# Patient Record
Sex: Female | Born: 1960 | Race: White | Hispanic: No | Marital: Married | Smoking: Never smoker
Health system: Southern US, Community
[De-identification: ages and names within clinical notes are randomized; demographics above are authoritative.]

## PROBLEM LIST (undated history)

## (undated) DIAGNOSIS — I059 Rheumatic mitral valve disease, unspecified: Secondary | ICD-10-CM

## (undated) DIAGNOSIS — F419 Anxiety disorder, unspecified: Secondary | ICD-10-CM

## (undated) DIAGNOSIS — K5792 Diverticulitis of intestine, part unspecified, without perforation or abscess without bleeding: Secondary | ICD-10-CM

## (undated) DIAGNOSIS — K589 Irritable bowel syndrome without diarrhea: Secondary | ICD-10-CM

## (undated) DIAGNOSIS — D569 Thalassemia, unspecified: Secondary | ICD-10-CM

## (undated) HISTORY — DX: Anxiety disorder, unspecified: F41.9

## (undated) HISTORY — PX: TONSILLECTOMY: SUR1361

## (undated) HISTORY — DX: Irritable bowel syndrome, unspecified: K58.9

## (undated) HISTORY — PX: COLONOSCOPY: SHX174

---

## 2005-01-05 ENCOUNTER — Ambulatory Visit: Payer: Self-pay | Admitting: Family Medicine

## 2005-05-16 ENCOUNTER — Ambulatory Visit: Payer: Self-pay | Admitting: Family Medicine

## 2005-08-30 ENCOUNTER — Ambulatory Visit: Payer: Self-pay

## 2006-09-18 ENCOUNTER — Ambulatory Visit: Payer: Self-pay | Admitting: Obstetrics and Gynecology

## 2007-03-08 ENCOUNTER — Emergency Department: Payer: Self-pay | Admitting: Emergency Medicine

## 2007-03-08 ENCOUNTER — Other Ambulatory Visit: Payer: Self-pay

## 2007-03-09 ENCOUNTER — Other Ambulatory Visit: Payer: Self-pay

## 2007-11-13 ENCOUNTER — Ambulatory Visit: Payer: Self-pay

## 2007-11-26 ENCOUNTER — Ambulatory Visit: Payer: Self-pay

## 2008-05-26 ENCOUNTER — Ambulatory Visit: Payer: Self-pay

## 2008-11-18 ENCOUNTER — Ambulatory Visit: Payer: Self-pay

## 2009-06-25 ENCOUNTER — Ambulatory Visit: Payer: Self-pay

## 2009-12-07 ENCOUNTER — Ambulatory Visit: Payer: Self-pay

## 2010-12-14 ENCOUNTER — Ambulatory Visit: Payer: Self-pay

## 2012-02-22 ENCOUNTER — Ambulatory Visit: Payer: Self-pay | Admitting: Obstetrics and Gynecology

## 2012-11-21 ENCOUNTER — Ambulatory Visit: Payer: Self-pay | Admitting: Internal Medicine

## 2013-06-26 ENCOUNTER — Ambulatory Visit: Payer: Self-pay

## 2013-07-03 ENCOUNTER — Ambulatory Visit: Payer: Self-pay

## 2013-08-12 ENCOUNTER — Ambulatory Visit: Payer: Self-pay | Admitting: Family Medicine

## 2014-02-10 DIAGNOSIS — I059 Rheumatic mitral valve disease, unspecified: Secondary | ICD-10-CM | POA: Insufficient documentation

## 2014-04-16 DIAGNOSIS — D569 Thalassemia, unspecified: Secondary | ICD-10-CM | POA: Insufficient documentation

## 2014-06-08 DIAGNOSIS — K5790 Diverticulosis of intestine, part unspecified, without perforation or abscess without bleeding: Secondary | ICD-10-CM | POA: Insufficient documentation

## 2014-07-01 ENCOUNTER — Ambulatory Visit: Payer: Self-pay

## 2015-06-01 ENCOUNTER — Other Ambulatory Visit: Payer: Self-pay | Admitting: Obstetrics and Gynecology

## 2015-06-01 DIAGNOSIS — Z1231 Encounter for screening mammogram for malignant neoplasm of breast: Secondary | ICD-10-CM

## 2015-07-07 ENCOUNTER — Ambulatory Visit
Admission: RE | Admit: 2015-07-07 | Discharge: 2015-07-07 | Disposition: A | Discharge status: Home / Self Care | Payer: BC Managed Care – PPO | Source: Ambulatory Visit | Attending: Obstetrics and Gynecology | Admitting: Obstetrics and Gynecology

## 2015-07-07 DIAGNOSIS — Z1231 Encounter for screening mammogram for malignant neoplasm of breast: Secondary | ICD-10-CM | POA: Diagnosis present

## 2016-06-20 ENCOUNTER — Other Ambulatory Visit: Payer: Self-pay | Admitting: Obstetrics and Gynecology

## 2016-06-20 DIAGNOSIS — Z1231 Encounter for screening mammogram for malignant neoplasm of breast: Secondary | ICD-10-CM

## 2016-07-12 ENCOUNTER — Other Ambulatory Visit: Payer: Self-pay | Admitting: Obstetrics and Gynecology

## 2016-07-12 ENCOUNTER — Ambulatory Visit
Admission: RE | Admit: 2016-07-12 | Discharge: 2016-07-12 | Disposition: A | Discharge status: Home / Self Care | Payer: BC Managed Care – PPO | Source: Ambulatory Visit | Attending: Obstetrics and Gynecology | Admitting: Obstetrics and Gynecology

## 2016-07-12 DIAGNOSIS — Z1231 Encounter for screening mammogram for malignant neoplasm of breast: Secondary | ICD-10-CM

## 2016-08-13 ENCOUNTER — Ambulatory Visit
Admission: EM | Admit: 2016-08-13 | Discharge: 2016-08-13 | Disposition: A | Discharge status: Home / Self Care | Payer: BC Managed Care – PPO | Attending: Family Medicine | Admitting: Family Medicine

## 2016-08-13 ENCOUNTER — Encounter: Payer: Self-pay | Admitting: Gynecology

## 2016-08-13 DIAGNOSIS — N390 Urinary tract infection, site not specified: Secondary | ICD-10-CM

## 2016-08-13 HISTORY — DX: Diverticulitis of intestine, part unspecified, without perforation or abscess without bleeding: K57.92

## 2016-08-13 HISTORY — DX: Rheumatic mitral valve disease, unspecified: I05.9

## 2016-08-13 HISTORY — DX: Thalassemia, unspecified: D56.9

## 2016-08-13 LAB — URINALYSIS COMPLETE WITH MICROSCOPIC (ARMC ONLY)
BILIRUBIN URINE: NEGATIVE
GLUCOSE, UA: NEGATIVE mg/dL
Ketones, ur: NEGATIVE mg/dL
Nitrite: NEGATIVE
Protein, ur: NEGATIVE mg/dL
Specific Gravity, Urine: 1.015 (ref 1.005–1.030)
pH: 8 (ref 5.0–8.0)

## 2016-08-13 MED ORDER — CEPHALEXIN 500 MG PO CAPS: 500.0000 mg | ORAL_CAPSULE | Freq: Two times a day (BID) | ORAL | 0 refills | Status: AC

## 2016-08-13 NOTE — ED Provider Notes (Signed)
[facility] ____________________________________________  Time seen: Approximately 9:00 AM  I have reviewed the triage vital signs and the nursing notes.   HISTORY  Chief Complaint Urinary Tract Infection  HPI Lisa Rowe is a 55 y.o. female  presents with complaints of urinary frequency, urinary urgency and burning with urination since this morning. Patient reports she felt like she is constantly had to urinate this morning. Patient reports she has had a history of urinary tract infections, with last being approximately 2 years ago, with similar presentation. Patient does reports that she has noticed this week that she did not drink as much water as she usually does as well as is holding her urine for longer periods of time just because she was busy at work. Patient reports she is concerned this could've been a trigger. Denies any other changes. Denies vaginal complaints.   Denies pain at this time. Reports continues to eat and drink well. Denies abdominal pain, flank pain, back pain, fevers, nausea, vomiting, diarrhea, constipation, weakness, chest pain, shortness of breath or other complaints. Denies renal insufficiency. Denies any resistance to antibiotics in the past.    Past Medical History:  Diagnosis Date  . Diverticulitis   . Mitral valve disorder   . Thalassanemia     There are no active problems to display for this patient.   Past Surgical History:  Procedure Laterality Date  . CESAREAN SECTION    . COLONOSCOPY    . TONSILLECTOMY        No current facility-administered medications for this encounter.   Current Outpatient Prescriptions:  .  cephALEXin (KEFLEX) 500 MG capsule, Take 1 capsule (500 mg total) by mouth 2 (two) times daily., Disp: 14 capsule, Rfl: 0  Allergies Codeine  Family History  Problem Relation Age of Onset  . Breast cancer Neg Hx     Social History Social History  Substance Use Topics  . Smoking status: Never  Smoker  . Smokeless tobacco: Never Used  . Alcohol use No    Review of Systems Constitutional: No fever/chills Eyes: No visual changes. ENT: No sore throat. Cardiovascular: Denies chest pain. Respiratory: Denies shortness of breath. Gastrointestinal: No abdominal pain.  No nausea, no vomiting.  No diarrhea.  No constipation. Genitourinary: positive for dysuria. Musculoskeletal: Negative for back pain. Skin: Negative for rash. Neurological: Negative for headaches, focal weakness or numbness.  10-point ROS otherwise negative.  ____________________________________________   PHYSICAL EXAM:  VITAL SIGNS: ED Triage Vitals  Enc Vitals Group     BP 08/13/16 0834 131/60     Pulse Rate 08/13/16 0834 70     Resp 08/13/16 0834 16     Temp 08/13/16 0834 98.7 F (37.1 C)     Temp Source 08/13/16 0834 Oral     SpO2 08/13/16 0834 100 %     Weight 08/13/16 0835 119 lb (54 kg)     Height 08/13/16 0835 5\' 3"  (1.6 m)     Head Circumference --      Peak Flow --      Pain Score 08/13/16 0836 2     Pain Loc --      Pain Edu? --      Excl. in [place]? --     Constitutional: Alert and oriented. Well appearing and in no acute distress. Eyes: Conjunctivae are normal. PERRL. EOMI. ENT      Head: Normocephalic and atraumatic.      Nose: No congestion/rhinnorhea.      Mouth/Throat: Mucous membranes  are moist. Cardiovascular: Normal rate, regular rhythm. Grossly normal heart sounds.  Good peripheral circulation. Respiratory: Normal respiratory effort without tachypnea nor retractions. Breath sounds are clear and equal bilaterally. No wheezes/rales/rhonchi.. Gastrointestinal: Soft and nontender. No distention. No CVA tenderness. Musculoskeletal: Ambulatory with steady gait.  Neurologic:  Normal speech and language. No gross focal neurologic deficits are appreciated. Speech is normal. No gait instability.  Skin:  Skin is warm, dry and intact. No rash noted. Psychiatric: Mood and affect are normal.  Speech and behavior are normal. Patient exhibits appropriate insight and judgment   ___________________________________________   LABS (all labs ordered are listed, but only abnormal results are displayed)  Labs Reviewed  URINALYSIS COMPLETEWITH MICROSCOPIC ([facility] ONLY) - Abnormal; Notable for the following:       Result Value   Color, Urine STRAW (*)    APPearance HAZY (*)    Hgb urine dipstick MODERATE (*)    Leukocytes, UA LARGE (*)    Bacteria, UA FEW (*)    Squamous Epithelial / LPF 0-5 (*)    All other components within normal limits  URINE CULTURE    PROCEDURES Procedures   INITIAL IMPRESSION / ASSESSMENT AND PLAN / ED COURSE  Pertinent labs & imaging results that were available during my care of the patient were reviewed by me and considered in my medical decision making (see chart for details).  Well-appearing patient. No acute distress. Urinalysis reviewed. Suspect urinary tract infection. Will culture urine. Will start patient on oral cephalexin. Encourage rest, fluids and PCP follow-up.  Discussed follow up with Primary care physician this week. Discussed follow up and return parameters including no resolution or any worsening concerns. Patient verbalized understanding and agreed to plan.   ____________________________________________   FINAL CLINICAL IMPRESSION(S) / ED DIAGNOSES  Final diagnoses:  UTI (lower urinary tract infection)     Discharge Medication List as of 08/13/2016  9:13 AM    START taking these medications   Details  cephALEXin (KEFLEX) 500 MG capsule Take 1 capsule (500 mg total) by mouth 2 (two) times daily., Starting Sat 08/13/2016, Until Sat 08/20/2016, Print        Note: This dictation was prepared with Dragon dictation along with smaller phrase technology. Any transcriptional errors that result from this process are unintentional.    Clinical Course      Renford Dills, NP 08/13/16 1009    Renford Dills, NP 08/13/16  1014

## 2016-08-13 NOTE — Discharge Instructions (Signed)
Take medication as prescribed. Rest. Drink plenty of fluids.  ° °Follow up with your primary care physician this week as needed. Return to Urgent care for new or worsening concerns.  ° °

## 2016-08-13 NOTE — ED Triage Notes (Signed)
Patient c/o urgency to urinate and painful x this am.

## 2016-08-16 LAB — URINE CULTURE: SPECIAL REQUESTS: NORMAL

## 2017-06-09 ENCOUNTER — Other Ambulatory Visit: Payer: Self-pay | Admitting: Obstetrics and Gynecology

## 2017-11-16 ENCOUNTER — Other Ambulatory Visit: Payer: Self-pay | Admitting: Obstetrics and Gynecology

## 2017-11-16 DIAGNOSIS — Z1231 Encounter for screening mammogram for malignant neoplasm of breast: Secondary | ICD-10-CM

## 2017-11-27 ENCOUNTER — Encounter: Payer: Self-pay | Admitting: Internal Medicine

## 2017-12-01 ENCOUNTER — Ambulatory Visit: Payer: Self-pay | Admitting: Internal Medicine

## 2017-12-05 ENCOUNTER — Ambulatory Visit
Admission: RE | Admit: 2017-12-05 | Discharge: 2017-12-05 | Disposition: A | Discharge status: Home / Self Care | Payer: BC Managed Care – PPO | Source: Ambulatory Visit | Attending: Obstetrics and Gynecology | Admitting: Obstetrics and Gynecology

## 2017-12-05 ENCOUNTER — Ambulatory Visit: Payer: BC Managed Care – PPO

## 2017-12-05 DIAGNOSIS — Z1231 Encounter for screening mammogram for malignant neoplasm of breast: Secondary | ICD-10-CM

## 2018-01-19 ENCOUNTER — Ambulatory Visit: Payer: Self-pay | Admitting: Internal Medicine

## 2018-03-23 ENCOUNTER — Ambulatory Visit: Payer: Self-pay | Admitting: Internal Medicine

## 2018-10-03 IMAGING — MG MM DIGITAL SCREENING BILAT W/ TOMO W/ CAD
9 of 12 series · 9 of 28 positions shown · non-contrast
Comparison: Previous exam(s).

CLINICAL DATA: Screening.

EXAM:
2D DIGITAL SCREENING BILATERAL MAMMOGRAM WITH 3D TOMO WITH CAD

[L MLO]
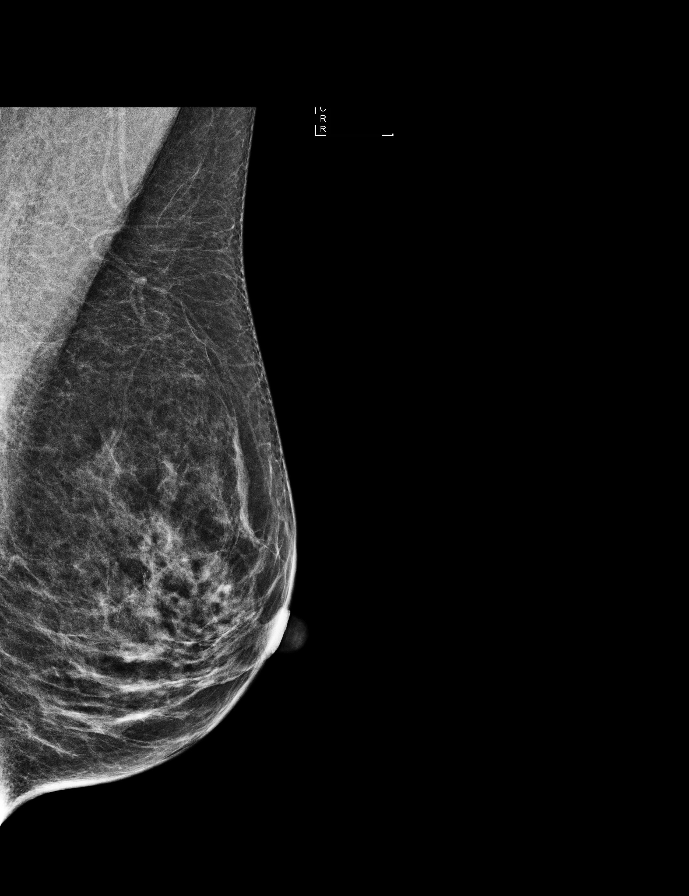

[L MLO synth-2D]
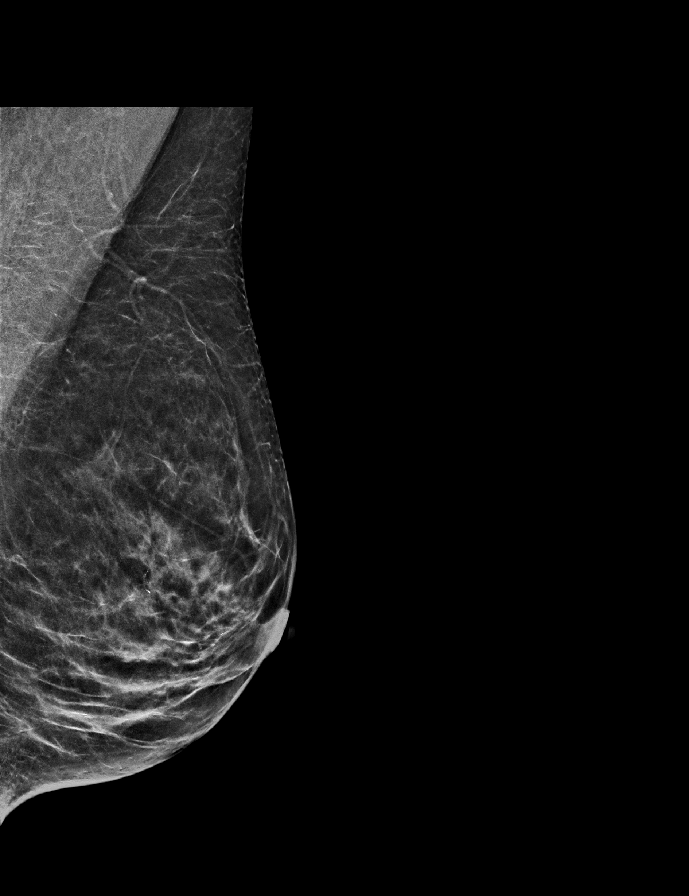

[R CC]
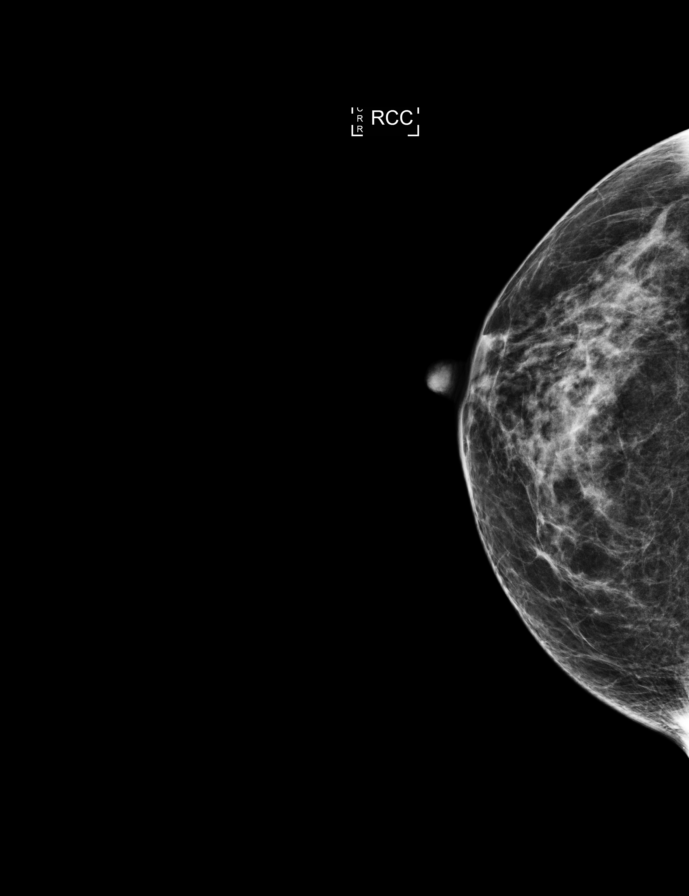

[R MLO synth-2D]
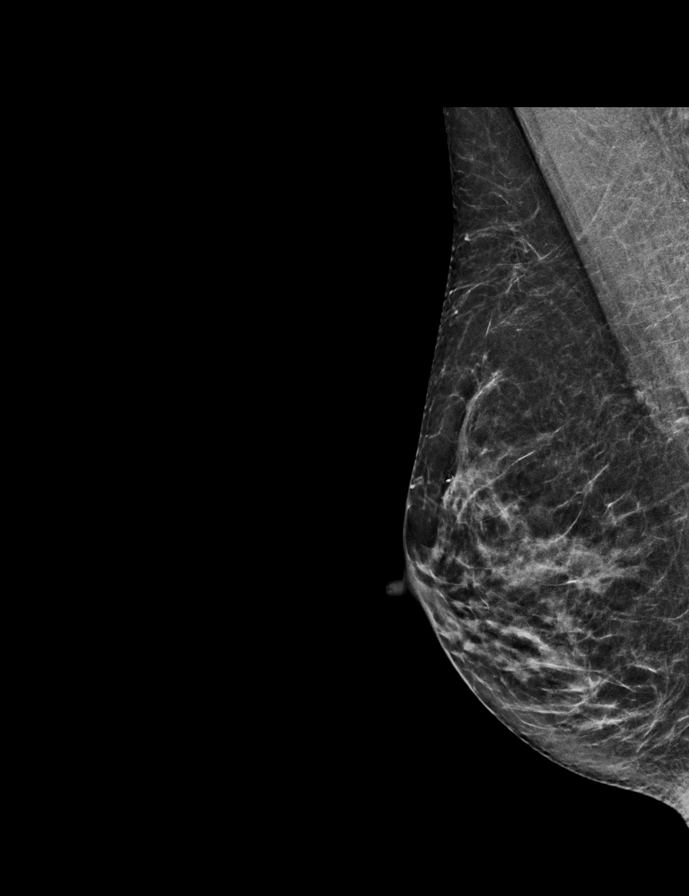

[R MLO]
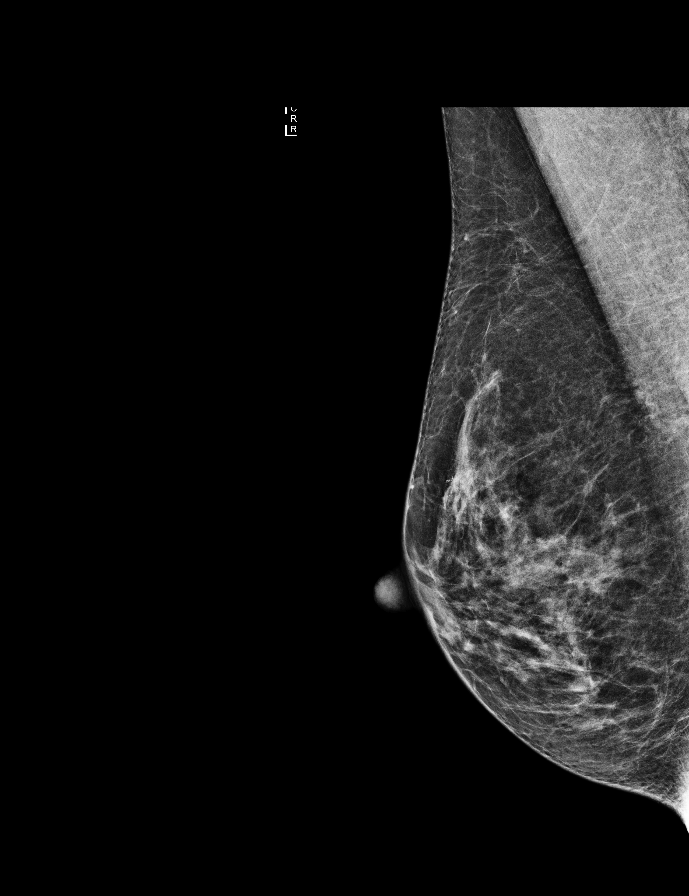

[R CC synth-2D]
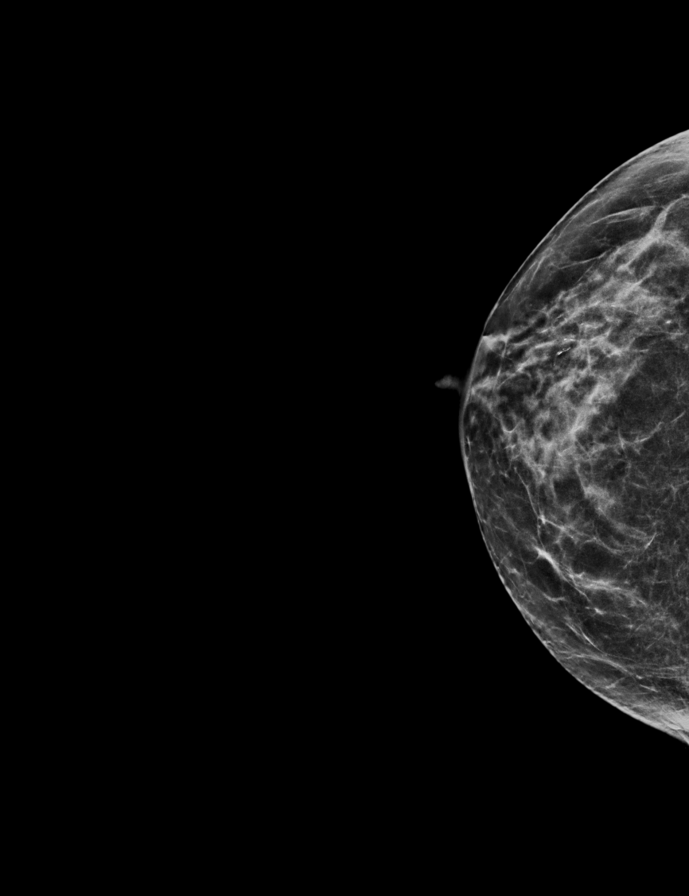

[L CC]
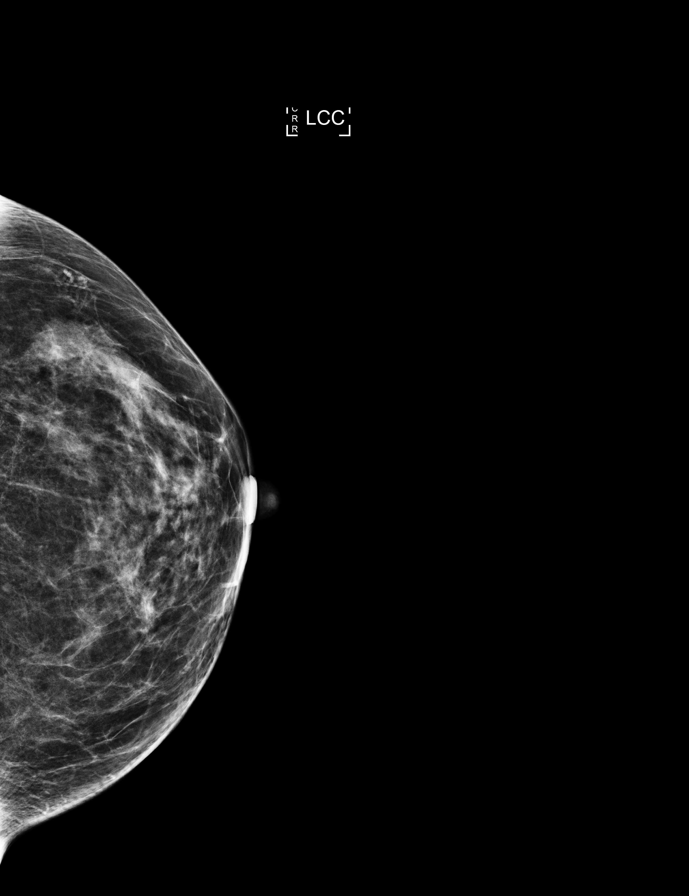

[L CC synth-2D]
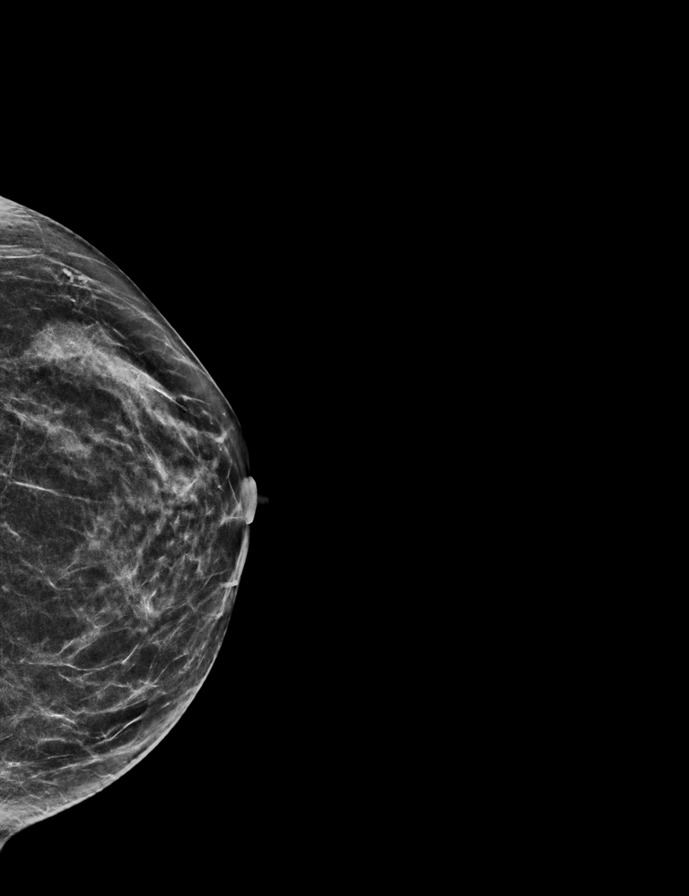

[L MLO tomo · tomo slice 27/52.0]
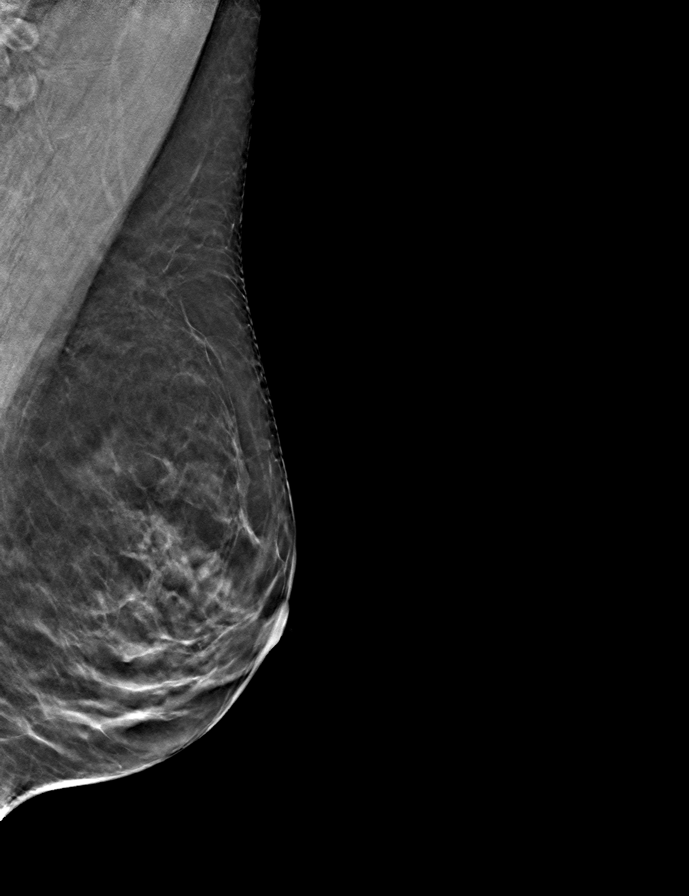

[9 of 28 positions shown; findings below may reference images not displayed]

ACR Breast Density Category c: The breast tissue is heterogeneously
dense, which may obscure small masses.
FINDINGS: There are no findings suspicious for malignancy. Images were
processed with CAD.
IMPRESSION: No mammographic evidence of malignancy. A result letter of this
screening mammogram will be mailed directly to the patient.

RECOMMENDATION:
Screening mammogram in one year. (Code:[id number])

BI-RADS CATEGORY  1: Negative.

## 2020-02-24 ENCOUNTER — Ambulatory Visit: Payer: BC Managed Care – PPO | Attending: Internal Medicine

## 2020-02-24 DIAGNOSIS — Z23 Encounter for immunization: Secondary | ICD-10-CM

## 2020-02-24 NOTE — Progress Notes (Signed)
   Covid-19 Vaccination Clinic  Name:  Lisa Rowe    MRN: [id number] DOB: 20-Nov-1961  02/24/2020  [name] was observed post Covid-19 immunization for 15 minutes without incident. She was provided with Vaccine Information Sheet and instruction to access the V-Safe system.   [name] was instructed to call 911 with any severe reactions post vaccine: Marland Kitchen Difficulty breathing  . Swelling of face and throat  . A fast heartbeat  . A bad rash all over body  . Dizziness and weakness   Immunizations Administered    Name Date Dose VIS Date Route   Pfizer COVID-19 Vaccine 02/24/2020  9:01 AM 0.3 mL 11/01/2019 Intramuscular   Manufacturer: [employer], [employer]   Lot: [id number]   NDC: [address]-0

## 2020-03-17 ENCOUNTER — Ambulatory Visit: Payer: BC Managed Care – PPO | Attending: Internal Medicine

## 2020-03-17 DIAGNOSIS — Z23 Encounter for immunization: Secondary | ICD-10-CM

## 2020-03-17 NOTE — Progress Notes (Signed)
   Covid-19 Vaccination Clinic  Name:  Lisa Rowe    MRN: [id number] DOB: [birth year]  03/17/2020  [name] was observed post Covid-19 immunization for 15 minutes without incident. She was provided with Vaccine Information Sheet and instruction to access the V-Safe system.   [name] was instructed to call 911 with any severe reactions post vaccine: Marland Kitchen Difficulty breathing  . Swelling of face and throat  . A fast heartbeat  . A bad rash all over body  . Dizziness and weakness   Immunizations Administered    Name Date Dose VIS Date Route   Pfizer COVID-19 Vaccine 03/17/2020 12:57 PM 0.3 mL 01/15/2019 Intramuscular   Manufacturer: [employer], [employer]   Lot: [id number]   NDC: [address]-4

## 2020-04-23 ENCOUNTER — Other Ambulatory Visit: Payer: Self-pay | Admitting: Family Medicine

## 2020-04-23 DIAGNOSIS — Z1231 Encounter for screening mammogram for malignant neoplasm of breast: Secondary | ICD-10-CM

## 2020-05-04 ENCOUNTER — Ambulatory Visit: Payer: BC Managed Care – PPO

## 2020-05-05 ENCOUNTER — Ambulatory Visit
Admission: RE | Admit: 2020-05-05 | Discharge: 2020-05-05 | Disposition: A | Discharge status: Home / Self Care | Payer: BC Managed Care – PPO | Source: Ambulatory Visit | Attending: Family Medicine | Admitting: Family Medicine

## 2020-05-05 ENCOUNTER — Other Ambulatory Visit: Payer: Self-pay

## 2020-05-05 DIAGNOSIS — Z1231 Encounter for screening mammogram for malignant neoplasm of breast: Secondary | ICD-10-CM

## 2021-02-01 DIAGNOSIS — R748 Abnormal levels of other serum enzymes: Secondary | ICD-10-CM | POA: Insufficient documentation

## 2021-02-01 DIAGNOSIS — K581 Irritable bowel syndrome with constipation: Secondary | ICD-10-CM | POA: Insufficient documentation

## 2021-02-01 DIAGNOSIS — K219 Gastro-esophageal reflux disease without esophagitis: Secondary | ICD-10-CM | POA: Insufficient documentation

## 2021-02-01 DIAGNOSIS — R3129 Other microscopic hematuria: Secondary | ICD-10-CM | POA: Insufficient documentation

## 2021-07-30 ENCOUNTER — Other Ambulatory Visit: Payer: Self-pay | Admitting: Family

## 2021-07-30 DIAGNOSIS — Z1231 Encounter for screening mammogram for malignant neoplasm of breast: Secondary | ICD-10-CM

## 2021-08-23 ENCOUNTER — Ambulatory Visit
Admission: RE | Admit: 2021-08-23 | Discharge: 2021-08-23 | Disposition: A | Discharge status: Home / Self Care | Payer: BC Managed Care – PPO | Source: Ambulatory Visit | Attending: Family | Admitting: Family

## 2021-08-23 ENCOUNTER — Other Ambulatory Visit: Payer: Self-pay

## 2021-08-23 DIAGNOSIS — Z1231 Encounter for screening mammogram for malignant neoplasm of breast: Secondary | ICD-10-CM | POA: Insufficient documentation

## 2022-08-03 ENCOUNTER — Other Ambulatory Visit: Payer: Self-pay | Admitting: Family

## 2022-08-03 DIAGNOSIS — Z1231 Encounter for screening mammogram for malignant neoplasm of breast: Secondary | ICD-10-CM

## 2022-08-25 ENCOUNTER — Ambulatory Visit
Admission: RE | Admit: 2022-08-25 | Discharge: 2022-08-25 | Disposition: A | Discharge status: Home / Self Care | Payer: BC Managed Care – PPO | Source: Ambulatory Visit | Attending: Family | Admitting: Family

## 2022-08-25 DIAGNOSIS — Z1231 Encounter for screening mammogram for malignant neoplasm of breast: Secondary | ICD-10-CM | POA: Diagnosis present

## 2023-02-05 ENCOUNTER — Ambulatory Visit
Admission: RE | Admit: 2023-02-05 | Discharge: 2023-02-05 | Disposition: A | Discharge status: Home / Self Care | Payer: BC Managed Care – PPO | Source: Ambulatory Visit

## 2023-02-05 VITALS — BP 109/69 | HR 73 | Temp 97.9°F | Resp 14 | Ht 63.0 in | Wt 135.0 lb

## 2023-02-05 DIAGNOSIS — N3000 Acute cystitis without hematuria: Secondary | ICD-10-CM | POA: Diagnosis not present

## 2023-02-05 LAB — URINALYSIS, W/ REFLEX TO CULTURE (INFECTION SUSPECTED)
Bilirubin Urine: NEGATIVE
Glucose, UA: NEGATIVE mg/dL
Ketones, ur: NEGATIVE mg/dL
Leukocytes,Ua: NEGATIVE
Nitrite: POSITIVE — AB
Protein, ur: NEGATIVE mg/dL
Specific Gravity, Urine: 1.015 (ref 1.005–1.030)
pH: 7 (ref 5.0–8.0)

## 2023-02-05 MED ORDER — FLUCONAZOLE 150 MG PO TABS: 150.0000 mg | ORAL_TABLET | Freq: Every day | ORAL | 0 refills | Status: AC

## 2023-02-05 MED ORDER — CEPHALEXIN 500 MG PO CAPS: 500.0000 mg | ORAL_CAPSULE | Freq: Two times a day (BID) | ORAL | 0 refills | Status: AC

## 2023-02-05 NOTE — ED Triage Notes (Signed)
Patient c/o dysuria and urinary frequency that started on Friday.  Patient states that she has been taking OTC AZO.  Patient denies fevers.

## 2023-02-05 NOTE — Discharge Instructions (Addendum)
Your urinalysis shows nitrates which is an enzyme released from bacteria, your urine will be sent to the lab to determine exactly which bacteria is present, if any changes need to be made to your medications you will be notified  Begin use of Keflex every morning and every evening for 5 days  You may use over-the-counter Azo to help minimize your symptoms until antibiotic removes bacteria, this medication will turn your urine orange  Begin use of amoxicillin as directed for your upcoming dental appointment  As you will be on 2 antibiotics it is possible that you will begin to experience a yeast infection therefore Diflucan has been sent to the pharmacy prophylactically, you can pick up this medicine if you begin to have vaginal itching, discharge or irritation, you will take 1 tablet once you get your medication and then you will take the second tablet after you finish all of your antibiotics  Increase your fluid intake through use of water  As always practice good hygiene, wiping front to back and avoidance of scented vaginal products to prevent further irritation  If symptoms continue to persist after use of medication or recur please follow-up with urgent care or your primary doctor as needed

## 2023-02-05 NOTE — ED Provider Notes (Signed)
[facility]    CSN: [id number] Arrival date & time: 02/05/23  1108      History   Chief Complaint Chief Complaint  Patient presents with   Dysuria    APPOINTMENT   Urinary Frequency     Lisa Rowe is a 62 y.o. female.   Patient presents for evaluation of urinary frequency and dysuria and intermittent mild lower abdominal pain beginning 2 days ago.  Has been using over-the-counter Azo which has been helpful.  Denies flank, hematuria and vaginal symptoms.  Past Medical History:  Diagnosis Date   Diverticulitis    Mitral valve disorder    Thalassanemia     Patient Active Problem List   Diagnosis Date Noted   Diverticulosis 06/08/2014   Thalassemia 04/16/2014   Mitral valve disease 02/10/2014    Past Surgical History:  Procedure Laterality Date   CESAREAN SECTION     COLONOSCOPY     TONSILLECTOMY      OB History   No obstetric history on file.      Home Medications    Prior to Admission medications   Medication Sig Start Date End Date Taking? Authorizing Provider  Cholecalciferol 25 MCG (1000 UT) capsule Take by mouth.    [provider]  Multiple Vitamin (MULTIVITAMIN) capsule Take 1 capsule by mouth daily.    [provider]  Omega-3 1000 MG CAPS Take by mouth.    [provider]  PARoxetine (PAXIL) 10 MG tablet Take 10 mg by mouth every morning.    [provider]    Family History Family History  Problem Relation Age of Onset   Breast cancer Neg Hx     Social History Social History   Tobacco Use   Smoking status: Never   Smokeless tobacco: Never  Vaping Use   Vaping Use: Never used  Substance Use Topics   Alcohol use: No   Drug use: Never     Allergies   Codeine   Review of Systems Review of Systems  Genitourinary:  Positive for dysuria, frequency and pelvic pain. Negative for decreased urine volume, difficulty urinating, dyspareunia, enuresis, flank pain, genital sores,  hematuria, menstrual problem, urgency, vaginal bleeding, vaginal discharge and vaginal pain.     Physical Exam Triage Vital Signs ED Triage Vitals  Enc Vitals Group     BP 02/05/23 1123 109/69     Pulse Rate 02/05/23 1123 73     Resp 02/05/23 1123 14     Temp 02/05/23 1123 97.9 F (36.6 C)     Temp Source 02/05/23 1123 Oral     SpO2 02/05/23 1123 95 %     Weight 02/05/23 1121 135 lb (61.2 kg)     Height 02/05/23 1121 5\' 3"  (1.6 m)     Head Circumference --      Peak Flow --      Pain Score 02/05/23 1120 5     Pain Loc --      Pain Edu? --      Excl. in [place]? --    No data found.  Updated Vital Signs BP 109/69 (BP Location: Right Arm)   Pulse 73   Temp 97.9 F (36.6 C) (Oral)   Resp 14   Ht 5\' 3"  (1.6 m)   Wt 135 lb (61.2 kg)   SpO2 95%   BMI 23.91 kg/m   Visual Acuity Right Eye Distance:   Left Eye Distance:   Bilateral Distance:    Right Eye  Near:   Left Eye Near:    Bilateral Near:     Physical Exam Constitutional:      Appearance: Normal appearance.  HENT:     Head: Normocephalic.  Eyes:     Extraocular Movements: Extraocular movements intact.  Abdominal:     General: Abdomen is flat. Bowel sounds are normal.     Palpations: Abdomen is soft.     Tenderness: There is abdominal tenderness in the suprapubic area.  Skin:    General: Skin is warm and dry.  Neurological:     Mental Status: She is alert and oriented to person, place, and time. Mental status is at baseline.      UC Treatments / Results  Labs (all labs ordered are listed, but only abnormal results are displayed) Labs Reviewed  URINALYSIS, W/ REFLEX TO CULTURE (INFECTION SUSPECTED) - Abnormal; Notable for the following components:      Result Value   Hgb urine dipstick TRACE (*)    Nitrite POSITIVE (*)    Bacteria, UA MANY (*)    All other components within normal limits  URINE CULTURE    EKG   Radiology No results found.  Procedures Procedures (including critical care  time)  Medications Ordered in UC Medications - No data to display  Initial Impression / Assessment and Plan / UC Course  I have reviewed the triage vital signs and the nursing notes.  Pertinent labs & imaging results that were available during my care of the patient were reviewed by me and considered in my medical decision making (see chart for details).  Acute cystitis without hematuria  Urinalysis showing nitrates, negative for leukocytes, sent for culture, prescribed Keflex, patient is to begin taking amoxicillin this week prophylactically for upcoming dental appointment, advised to take as prescribed, Diflucan sent prophylactically as she will be on 2 antibiotics, recommended continued use of Azo as needed as well as increase fluid intake primarily through water and good hygiene measures for additional support given precautions that if symptoms persist or worsen she is to follow-up for reevaluation Final Clinical Impressions(s) / UC Diagnoses   Final diagnoses:  Acute cystitis without hematuria     Discharge Instructions      Your urinalysis shows nitrates which is an enzyme released from bacteria, your urine will be sent to the lab to determine exactly which bacteria is present, if any changes need to be made to your medications you will be notified  Begin use of Keflex every morning and every evening for 5 days  You may use over-the-counter Azo to help minimize your symptoms until antibiotic removes bacteria, this medication will turn your urine orange  Increase your fluid intake through use of water  As always practice good hygiene, wiping front to back and avoidance of scented vaginal products to prevent further irritation  If symptoms continue to persist after use of medication or recur please follow-up with urgent care or your primary doctor as needed    ED Prescriptions   None    PDMP not reviewed this encounter.   Hans [place], [place] 02/05/23 1207

## 2023-08-09 ENCOUNTER — Other Ambulatory Visit: Payer: Self-pay | Admitting: Family

## 2023-08-09 DIAGNOSIS — Z1231 Encounter for screening mammogram for malignant neoplasm of breast: Secondary | ICD-10-CM

## 2023-09-21 ENCOUNTER — Ambulatory Visit: Payer: BC Managed Care – PPO

## 2023-09-26 ENCOUNTER — Ambulatory Visit
Admission: RE | Admit: 2023-09-26 | Discharge: 2023-09-26 | Disposition: A | Discharge status: Home / Self Care | Payer: BC Managed Care – PPO | Source: Ambulatory Visit | Attending: Family | Admitting: Family

## 2023-09-26 DIAGNOSIS — Z1231 Encounter for screening mammogram for malignant neoplasm of breast: Secondary | ICD-10-CM | POA: Diagnosis present

## 2023-10-18 ENCOUNTER — Ambulatory Visit: Payer: BC Managed Care – PPO | Admitting: Family Medicine

## 2023-10-26 ENCOUNTER — Ambulatory Visit: Payer: Self-pay | Admitting: Family Medicine

## 2024-03-14 NOTE — Patient Instructions (Signed)

## 2024-03-21 ENCOUNTER — Encounter: Payer: Self-pay | Admitting: Nurse Practitioner

## 2024-03-21 ENCOUNTER — Ambulatory Visit: Payer: Self-pay | Admitting: Nurse Practitioner

## 2024-03-21 VITALS — BP 97/63 | HR 80 | Temp 98.7°F | Ht 63.2 in | Wt 137.6 lb

## 2024-03-21 DIAGNOSIS — K581 Irritable bowel syndrome with constipation: Secondary | ICD-10-CM

## 2024-03-21 DIAGNOSIS — Z7689 Persons encountering health services in other specified circumstances: Secondary | ICD-10-CM

## 2024-03-21 DIAGNOSIS — K219 Gastro-esophageal reflux disease without esophagitis: Secondary | ICD-10-CM | POA: Diagnosis not present

## 2024-03-21 DIAGNOSIS — D568 Other thalassemias: Secondary | ICD-10-CM

## 2024-03-21 DIAGNOSIS — F411 Generalized anxiety disorder: Secondary | ICD-10-CM | POA: Diagnosis not present

## 2024-03-21 NOTE — Assessment & Plan Note (Signed)
 Chronic and ongoing.  Recommend she start using Metamucil gummies on daily basis to maintain a good bowel regimen.  Ensure plenty of water and fiber intake daily.  Goal is 3 to 4 BM weekly with no straining.  Educated her on this plan.

## 2024-03-21 NOTE — Assessment & Plan Note (Signed)
 Chronic, stable with Paxil.  Has taken for years and tolerates well.  Continue this regimen.  Denies SI/HI. Will send refills upon request.

## 2024-03-21 NOTE — Assessment & Plan Note (Addendum)
 Chronic and stable with minimal use of Prilosec.  Continue to monitor.  She obtains medication OTC. Risks of PPI use were discussed with patient including bone loss, C. Diff diarrhea, pneumonia, infections, CKD, electrolyte abnormalities.  Verbalizes understanding and chooses to continue the medication.

## 2024-03-21 NOTE — Assessment & Plan Note (Signed)
 Chronic, stable.  Family history mom and brothers.  Recent labs H/H lower levels, but iron normal.

## 2024-03-21 NOTE — Progress Notes (Signed)
 New Patient Office Visit  Subjective    Patient ID: Lisa Rowe, female    DOB: 11/15/1961  Age: 63 y.o. MRN: [id number]  CC:  Chief Complaint  Patient presents with   Constipation   Gastroesophageal Reflux   Anxiety   HPI Oline Doss Malave presents for new patient visit to establish care.  Introduced to Publishing rights manager role and practice setting.  All questions answered.  Discussed provider/patient relationship and expectations.  Previous care at [facility].  Thalassemia present, iron levels stable.  GERD & CONSTIPATION Occasional indigestion issues, has not taken Prilosec in a long time.  Has had constipation since her 2's.  Tries to drink water daily, but probably not enough.  Takes Metamucil occasionally.  Has a BM 2 times a week.  Does strain with BM.   Had colonoscopy 10 years ago, due for this in the summer (June or July).  Needs to be more consistent. GERD control status: stable Satisfied with current treatment? yes Heartburn frequency: rarely  Medication side effects: no  Medication compliance: stable Alleviatiating factors: medication Aggravating factors:  citrus foods Dysphagia: no Odynophagia:  no Hematemesis: no Blood in stool: no EGD: no   ANXIETY Was given Paxil 10 MG since 2021 for IBS, but had anxiety too.  This does help overall with mood.  Has fears of flying as concerns something will happen.  Did have panic attacks at one point over driving. Since she started taking this she has been able to drive again and GI symptoms have improved. Mood status: stable Satisfied with current treatment?: yes Symptom severity: mild  Duration of current treatment : chronic Side effects: no Medication compliance: good compliance Psychotherapy/counseling: none Previous psychiatric medications: Paxil Depressed mood: no Anxious mood: no -- does over flying Anhedonia: no Significant weight loss or gain: no Insomnia: yes hard to fall asleep on occasion, tried  Melatonin but made her awake  Fatigue: no Feelings of worthlessness or guilt: no Impaired concentration/indecisiveness: no Suicidal ideations: no Hopelessness: no Crying spells: no    03/21/2024    9:03 AM  Depression screen PHQ 2/9  Decreased Interest 0  Down, Depressed, Hopeless 0  PHQ - 2 Score 0  Altered sleeping 1  Tired, decreased energy 0  Change in appetite 0  Feeling bad or failure about yourself  0  Trouble concentrating 0  Moving slowly or fidgety/restless 0  Suicidal thoughts 0  PHQ-9 Score 1  Difficult doing work/chores Not difficult at all       03/21/2024    9:03 AM  GAD 7 : Generalized Anxiety Score  Nervous, Anxious, on Edge 0  Control/stop worrying 0  Worry too much - different things 0  Trouble relaxing 0  Restless 0  Easily annoyed or irritable 0  Afraid - awful might happen 0  Total GAD 7 Score 0  Anxiety Difficulty Not difficult at all    Outpatient Encounter Medications as of 03/21/2024  Medication Sig   Cholecalciferol 25 MCG (1000 UT) capsule Take 1,000 Units by mouth daily.   Multiple Vitamin (MULTIVITAMIN) capsule Take 1 capsule by mouth daily.   Omega 3 1000 MG CAPS Take 1 capsule by mouth daily.   omeprazole (PRILOSEC) 10 MG capsule Take 10 mg by mouth daily as needed.   PARoxetine (PAXIL) 10 MG tablet Take 10 mg by mouth every morning.   Probiotic Product (PROBIOTIC PO) Take 1 tablet by mouth daily at 2 PM.   SOOLANTRA 1 % CREA Apply topically at bedtime.   [  DISCONTINUED] Omega-3 1000 MG CAPS Take by mouth.   No facility-administered encounter medications on file as of 03/21/2024.    Past Medical History:  Diagnosis Date   Anxiety    Diverticulitis    IBS (irritable bowel syndrome)    Mitral valve disorder    Thalassanemia     Past Surgical History:  Procedure Laterality Date   CESAREAN SECTION     x2   COLONOSCOPY     TONSILLECTOMY      Family History  Problem Relation Age of Onset   Dementia Mother    Thalassemia Mother     Hypertension Father    Diabetes Father    Heart attack Father    Dementia Maternal Grandmother    Hypertension Paternal Grandmother    Diabetes Paternal Grandmother    Hypertension Paternal Grandfather    Breast cancer Neg Hx     Social History   Socioeconomic History   Marital status: Married    Spouse name: Not on file   Number of children: Not on file   Years of education: Not on file   Highest education level: Not on file  Occupational History   Not on file  Tobacco Use   Smoking status: Never   Smokeless tobacco: Never  Vaping Use   Vaping status: Never Used  Substance and Sexual Activity   Alcohol use: No   Drug use: Never   Sexual activity: Yes  Other Topics Concern   Not on file  Social History Narrative   Not on file   Social Drivers of Health   Financial Resource Strain: Low Risk  (03/21/2024)   Overall Financial Resource Strain (CARDIA)    Difficulty of Paying Living Expenses: Not hard at all  Food Insecurity: No Food Insecurity (03/21/2024)   Hunger Vital Sign    Worried About Running Out of Food in the Last Year: Never true    Ran Out of Food in the Last Year: Never true  Transportation Needs: No Transportation Needs (03/21/2024)   PRAPARE - Administrator, Civil Service (Medical): No    Lack of Transportation (Non-Medical): No  Physical Activity: Insufficiently Active (03/21/2024)   Exercise Vital Sign    Days of Exercise per Week: 2 days    Minutes of Exercise per Session: 30 min  Stress: No Stress Concern Present (03/21/2024)   [employer] of Occupational Health - Occupational Stress Questionnaire    Feeling of Stress : Not at all  Social Connections: Moderately Integrated (03/21/2024)   Social Connection and Isolation Panel [NHANES]    Frequency of Communication with Friends and Family: More than three times a week    Frequency of Social Gatherings with Friends and Family: More than three times a week    Attends Religious Services:  More than 4 times per year    Active Member of [employer] or Organizations: No    Attends Banker Meetings: Never    Marital Status: Married  Catering manager Violence: Not At Risk (03/21/2024)   Humiliation, Afraid, Rape, and Kick questionnaire    Fear of Current or Ex-Partner: No    Emotionally Abused: No    Physically Abused: No    Sexually Abused: No    Review of Systems  Constitutional:  Negative for chills, diaphoresis, fever and weight loss.  Respiratory:  Negative for cough, shortness of breath and wheezing.   Cardiovascular:  Negative for chest pain, palpitations, orthopnea and leg swelling.  Gastrointestinal:  Positive for  constipation. Negative for diarrhea and heartburn.  Neurological: Negative.   Psychiatric/Behavioral:  Negative for depression and suicidal ideas. The patient has insomnia. The patient is not nervous/anxious.        Objective    BP 97/63   Pulse 80   Temp 98.7 F (37.1 C) (Oral)   Ht 5' 3.2" (1.605 m)   Wt 137 lb 9.6 oz (62.4 kg)   SpO2 96%   BMI 24.22 kg/m   Physical Exam Vitals and nursing note reviewed.  Constitutional:      General: She is awake. She is not in acute distress.    Appearance: She is well-developed and well-groomed. She is not ill-appearing or toxic-appearing.  HENT:     Head: Normocephalic.     Right Ear: Hearing and external ear normal.     Left Ear: Hearing and external ear normal.  Eyes:     General: Lids are normal.        Right eye: No discharge.        Left eye: No discharge.     Conjunctiva/sclera: Conjunctivae normal.     Pupils: Pupils are equal, round, and reactive to light.  Neck:     Thyroid: No thyromegaly.     Vascular: No carotid bruit.  Cardiovascular:     Rate and Rhythm: Normal rate and regular rhythm.     Heart sounds: Normal heart sounds. No murmur heard.    No gallop.  Pulmonary:     Effort: Pulmonary effort is normal. No accessory muscle usage or respiratory distress.     Breath  sounds: Normal breath sounds.  Abdominal:     General: Bowel sounds are normal. There is no distension.     Palpations: Abdomen is soft.     Tenderness: There is no abdominal tenderness.  Musculoskeletal:     Cervical back: Normal range of motion and neck supple.     Right lower leg: No edema.     Left lower leg: No edema.  Lymphadenopathy:     Cervical: No cervical adenopathy.  Skin:    General: Skin is warm and dry.  Neurological:     Mental Status: She is alert and oriented to person, place, and time.     Deep Tendon Reflexes: Reflexes are normal and symmetric.     Reflex Scores:      Brachioradialis reflexes are 2+ on the right side and 2+ on the left side.      Patellar reflexes are 2+ on the right side and 2+ on the left side. Psychiatric:        Attention and Perception: Attention normal.        Mood and Affect: Mood normal.        Speech: Speech normal.        Behavior: Behavior normal. Behavior is cooperative.        Thought Content: Thought content normal.       Assessment & Plan:   Problem List Items Addressed This Visit       Digestive   Irritable bowel syndrome with constipation   Chronic and ongoing.  Recommend she start using Metamucil gummies on daily basis to maintain a good bowel regimen.  Ensure plenty of water and fiber intake daily.  Goal is 3 to 4 BM weekly with no straining.  Educated her on this plan.      Relevant Medications   omeprazole (PRILOSEC) 10 MG capsule   Probiotic Product (PROBIOTIC PO)   Gastroesophageal reflux  disease   Chronic and stable with minimal use of Prilosec.  Continue to monitor.  She obtains medication OTC. Risks of PPI use were discussed with patient including bone loss, C. Diff diarrhea, pneumonia, infections, CKD, electrolyte abnormalities.  Verbalizes understanding and chooses to continue the medication.       Relevant Medications   omeprazole (PRILOSEC) 10 MG capsule   Probiotic Product (PROBIOTIC PO)     Other    Thalassemia - Primary   Chronic, stable.  Family history mom and brothers.  Recent labs H/H lower levels, but iron normal.      Generalized anxiety disorder   Chronic, stable with Paxil.  Has taken for years and tolerates well.  Continue this regimen.  Denies SI/HI. Will send refills upon request.      Other Visit Diagnoses       Encounter to establish care       New patient to clinic, introduced to NP role and practice.       Return in about 6 months (around 09/09/2024) for Annual Physical after October 18th.   Leighton Luster T Shade Kaley, NP

## 2024-05-31 ENCOUNTER — Telehealth: Payer: Self-pay

## 2024-05-31 DIAGNOSIS — Z1211 Encounter for screening for malignant neoplasm of colon: Secondary | ICD-10-CM

## 2024-05-31 NOTE — Telephone Encounter (Signed)
 Copied from CRM [id number]. Topic: Referral - Request for Referral >> May 31, 2024 12:04 PM Kevelyn M wrote: Did the patient discuss referral with their provider in the last year? Yes (If No - schedule appointment) (If Yes - send message)  Type of order/referral and detailed reason for visit: Colonoscopy  Preference of office, provider, location: [facility] clinic  If referral order, have you been seen by this specialty before? Yes (If Yes, this issue or another issue? When? Where?  Can we respond through MyChart? Yes

## 2024-06-03 NOTE — Addendum Note (Signed)
 Addended by: Saad Buhl T on: 06/03/2024 08:08 AM   Modules accepted: Orders

## 2024-06-03 NOTE — Telephone Encounter (Signed)
Called and LVM letting patient know that the referral has been entered for her.

## 2024-06-04 ENCOUNTER — Other Ambulatory Visit: Payer: Self-pay | Admitting: Nurse Practitioner

## 2024-06-04 ENCOUNTER — Encounter: Payer: Self-pay | Admitting: Nurse Practitioner

## 2024-06-04 DIAGNOSIS — Z1211 Encounter for screening for malignant neoplasm of colon: Secondary | ICD-10-CM

## 2024-08-22 ENCOUNTER — Ambulatory Visit: Payer: Self-pay

## 2024-08-22 DIAGNOSIS — K5730 Diverticulosis of large intestine without perforation or abscess without bleeding: Secondary | ICD-10-CM | POA: Diagnosis not present

## 2024-08-22 DIAGNOSIS — Z1211 Encounter for screening for malignant neoplasm of colon: Secondary | ICD-10-CM | POA: Diagnosis present

## 2024-09-21 LAB — HEPATITIS C ANTIBODY: Hepatitis C Ab: REACTIVE

## 2024-09-21 NOTE — Patient Instructions (Signed)
 Be Involved in Caring For Your Health:  Taking Medications When medications are taken as directed, they can greatly improve your health. But if they are not taken as prescribed, they may not work. In some cases, not taking them correctly can be harmful. To help ensure your treatment remains effective and safe, understand your medications and how to take them. Bring your medications to each visit for review by your provider.  Your lab results, notes, and after visit summary will be available on My Chart. We strongly encourage you to use this feature. If lab results are abnormal the clinic will contact you with the appropriate steps. If the clinic does not contact you assume the results are satisfactory. You can always view your results on My Chart. If you have questions regarding your health or results, please contact the clinic during office hours. You can also ask questions on My Chart.  We at [facility] are grateful that you chose us  to provide your care. We strive to provide evidence-based and compassionate care and are always looking for feedback. If you get a survey from the clinic please complete this so we can hear your opinions.  Healthy Eating, Adult Healthy eating may help you get and keep a healthy body weight, reduce the risk of chronic disease, and live a long and productive life. It is important to follow a healthy eating pattern. Your nutritional and calorie needs should be met mainly by different nutrient-rich foods. What are tips for following this plan? Reading food labels Read labels and choose the following: Reduced or low sodium products. Juices with 100% fruit juice. Foods with low saturated fats (<3 g per serving) and high polyunsaturated and monounsaturated fats. Foods with whole grains, such as whole wheat, cracked wheat, brown rice, and wild rice. Whole grains that are fortified with folic acid. This is recommended for females who are pregnant or who want to  become pregnant. Read labels and do not eat or drink the following: Foods or drinks with added sugars. These include foods that contain brown sugar, corn sweetener, corn syrup, dextrose , fructose, glucose, high-fructose corn syrup, honey, invert sugar, lactose, malt syrup, maltose, molasses, raw sugar, sucrose, trehalose, or turbinado sugar. Limit your intake of added sugars to less than 10% of your total daily calories. Do not eat more than the following amounts of added sugar per day: 6 teaspoons (25 g) for females. 9 teaspoons (38 g) for males. Foods that contain processed or refined starches and grains. Refined grain products, such as white flour, degermed cornmeal, white bread, and white rice. Shopping Choose nutrient-rich snacks, such as vegetables, whole fruits, and nuts. Avoid high-calorie and high-sugar snacks, such as potato chips, fruit snacks, and candy. Use oil-based dressings and spreads on foods instead of solid fats such as butter, margarine, sour cream, or cream cheese. Limit pre-made sauces, mixes, and instant products such as flavored rice, instant noodles, and ready-made pasta. Try more plant-protein sources, such as tofu, tempeh, black beans, edamame, lentils, nuts, and seeds. Explore eating plans such as the Mediterranean diet or vegetarian diet. Try heart-healthy dips made with beans and healthy fats like hummus and guacamole. Vegetables go great with these. Cooking Use oil to saut or stir-fry foods instead of solid fats such as butter, margarine, or lard. Try baking, boiling, grilling, or broiling instead of frying. Remove the fatty part of meats before cooking. Steam vegetables in water  or broth. Meal planning  At meals, imagine dividing your plate into fourths: One-half of  your plate is fruits and vegetables. One-fourth of your plate is whole grains. One-fourth of your plate is protein, especially lean meats, poultry, eggs, tofu, beans, or nuts. Include low-fat  dairy as part of your daily diet. Lifestyle Choose healthy options in all settings, including home, work, school, restaurants, or stores. Prepare your food safely: Wash your hands after handling raw meats. Where you prepare food, keep surfaces clean by regularly washing with hot, soapy water . Keep raw meats separate from ready-to-eat foods, such as fruits and vegetables. Cook seafood, meat, poultry, and eggs to the recommended temperature. Get a food thermometer. Store foods at safe temperatures. In general: Keep cold foods at 84F (4.4C) or below. Keep hot foods at 184F (60C) or above. Keep your freezer at [facility] (-17.8C) or below. Foods are not safe to eat if they have been between the temperatures of 40-184F (4.4-60C) for more than 2 hours. What foods should I eat? Fruits Aim to eat 1-2 cups of fresh, canned (in natural juice), or frozen fruits each day. One cup of fruit equals 1 small apple, 1 large banana, 8 large strawberries, 1 cup (237 g) canned fruit,  cup (82 g) dried fruit, or 1 cup (240 mL) 100% juice. Vegetables Aim to eat 2-4 cups of fresh and frozen vegetables each day, including different varieties and colors. One cup of vegetables equals 1 cup (91 g) broccoli or cauliflower florets, 2 medium carrots, 2 cups (150 g) raw, leafy greens, 1 large tomato, 1 large bell pepper, 1 large sweet potato, or 1 medium white potato. Grains Aim to eat 5-10 ounce-equivalents of whole grains each day. Examples of 1 ounce-equivalent of grains include 1 slice of bread, 1 cup (40 g) ready-to-eat cereal, 3 cups (24 g) popcorn, or  cup (93 g) cooked rice. Meats and other proteins Try to eat 5-7 ounce-equivalents of protein each day. Examples of 1 ounce-equivalent of protein include 1 egg,  oz nuts (12 almonds, 24 pistachios, or 7 walnut halves), 1/4 cup (90 g) cooked beans, 6 tablespoons (90 g) hummus or 1 tablespoon (16 g) peanut butter. A cut of meat or fish that is the size of a deck of  cards is about 3-4 ounce-equivalents (85 g). Of the protein you eat each week, try to have at least 8 sounce (227 g) of seafood. This is about 2 servings per week. This includes salmon, trout, herring, sardines, and anchovies. Dairy Aim to eat 3 cup-equivalents of fat-free or low-fat dairy each day. Examples of 1 cup-equivalent of dairy include 1 cup (240 mL) milk, 8 ounces (250 g) yogurt, 1 ounces (44 g) natural cheese, or 1 cup (240 mL) fortified soy milk. Fats and oils Aim for about 5 teaspoons (21 g) of fats and oils per day. Choose monounsaturated fats, such as canola and olive oils, mayonnaise made with olive oil or avocado oil, avocados, peanut butter, and most nuts, or polyunsaturated fats, such as sunflower, corn, and soybean oils, walnuts, pine nuts, sesame seeds, sunflower seeds, and flaxseed. Beverages Aim for 6 eight-ounce glasses of water  per day. Limit coffee to 3-5 eight-ounce cups per day. Limit caffeinated beverages that have added calories, such as soda and energy drinks. If you drink alcohol: Limit how much you have to: 0-1 drink a day if you are female. 0-2 drinks a day if you are female. Know how much alcohol is in your drink. In the [place] one drink is one 12 oz bottle of beer (355 mL), one 5 oz glass of wine (  148 mL), or one 1 oz glass of hard liquor (44 mL). Seasoning and other foods Try not to add too much salt to your food. Try using herbs and spices instead of salt. Try not to add sugar to food. This information is based on [place] nutrition guidelines. To learn more, visit [employer].be. Exact amounts may vary. You may need different amounts. This information is not intended to replace advice given to you by your health care provider. Make sure you discuss any questions you have with your health care provider. Document Revised: 08/08/2022 Document Reviewed: 08/08/2022 Elsevier Patient Education  2024 [employer].

## 2024-09-23 ENCOUNTER — Ambulatory Visit (INDEPENDENT_AMBULATORY_CARE_PROVIDER_SITE_OTHER): Admitting: Nurse Practitioner

## 2024-09-23 ENCOUNTER — Encounter: Payer: Self-pay | Admitting: Nurse Practitioner

## 2024-09-23 VITALS — BP 119/78 | HR 78 | Temp 97.8°F | Ht 63.0 in | Wt 139.0 lb

## 2024-09-23 DIAGNOSIS — Z0000 Encounter for general adult medical examination without abnormal findings: Secondary | ICD-10-CM

## 2024-09-23 DIAGNOSIS — Z114 Encounter for screening for human immunodeficiency virus [HIV]: Secondary | ICD-10-CM

## 2024-09-23 DIAGNOSIS — Z136 Encounter for screening for cardiovascular disorders: Secondary | ICD-10-CM

## 2024-09-23 DIAGNOSIS — F411 Generalized anxiety disorder: Secondary | ICD-10-CM | POA: Diagnosis not present

## 2024-09-23 DIAGNOSIS — D568 Other thalassemias: Secondary | ICD-10-CM

## 2024-09-23 DIAGNOSIS — E559 Vitamin D deficiency, unspecified: Secondary | ICD-10-CM

## 2024-09-23 DIAGNOSIS — Z1231 Encounter for screening mammogram for malignant neoplasm of breast: Secondary | ICD-10-CM

## 2024-09-23 DIAGNOSIS — Z13220 Encounter for screening for lipoid disorders: Secondary | ICD-10-CM

## 2024-09-23 DIAGNOSIS — K219 Gastro-esophageal reflux disease without esophagitis: Secondary | ICD-10-CM

## 2024-09-23 DIAGNOSIS — Z23 Encounter for immunization: Secondary | ICD-10-CM

## 2024-09-23 DIAGNOSIS — I059 Rheumatic mitral valve disease, unspecified: Secondary | ICD-10-CM

## 2024-09-23 DIAGNOSIS — K581 Irritable bowel syndrome with constipation: Secondary | ICD-10-CM | POA: Diagnosis not present

## 2024-09-23 MED ORDER — PAROXETINE HCL 10 MG PO TABS: 10.0000 mg | ORAL_TABLET | Freq: Every morning | ORAL | 4 refills | Status: AC

## 2024-09-23 MED ORDER — ALPRAZOLAM 0.5 MG PO TABS: 0.5000 mg | ORAL_TABLET | Freq: Two times a day (BID) | ORAL | 0 refills | Status: AC | PRN

## 2024-09-23 NOTE — Assessment & Plan Note (Signed)
 Chronic, stable with Paxil.  Has taken for years and tolerates well.  Continue this regimen.  Denies SI/HI. Will send refills.  Sent in #10 Xanax 0.5 MG to take only for fear of flying as she has upcoming travels to [place]  and is very nervous.  Educated her on this.

## 2024-09-23 NOTE — Assessment & Plan Note (Addendum)
 Chronic and ongoing.  Continue current OTC regimen.  Ensure plenty of water and fiber intake daily.  Goal is 3 to 4 BM weekly with no straining.  Educated her on this plan. Labs outpatient.

## 2024-09-23 NOTE — Assessment & Plan Note (Addendum)
 Chronic, stable.  Family history mom and brothers.  Recent labs H/H lower levels, but iron normal. Recheck CBC outpatient. Refer to hematology as needed.

## 2024-09-23 NOTE — Progress Notes (Signed)
 BP 119/78   Pulse 78   Temp 97.8 F (36.6 C) (Oral)   Ht 5' 3 (1.6 m)   Wt 139 lb (63 kg)   SpO2 97%   BMI 24.62 kg/m    Subjective:    Patient ID: Lisa Rowe, female    DOB: 10-Jul-1961, 63 y.o.   MRN: [id number]  HPI: Lisa Rowe is a 63 y.o. female presenting on 09/23/2024 for comprehensive medical examination. Current medical complaints include:none  She currently lives with: husband Menopausal Symptoms: no  Her son lives in [place]  and she is nervous about the flight.  Would like something to help her during flight.  Continues on Paxil daily for her anxiety.  Depression Screen done today and results listed below:     09/23/2024    1:05 PM 03/21/2024    9:03 AM  Depression screen PHQ 2/9  Decreased Interest 0 0  Down, Depressed, Hopeless 0 0  PHQ - 2 Score 0 0  Altered sleeping 1 1  Tired, decreased energy 0 0  Change in appetite 0 0  Feeling bad or failure about yourself  0 0  Trouble concentrating 0 0  Moving slowly or fidgety/restless 0 0  Suicidal thoughts 0 0  PHQ-9 Score 1 1  Difficult doing work/chores Not difficult at all Not difficult at all      09/23/2024    1:06 PM 03/21/2024    9:03 AM  GAD 7 : Generalized Anxiety Score  Nervous, Anxious, on Edge 0 0  Control/stop worrying 0 0  Worry too much - different things 0 0  Trouble relaxing 0 0  Restless 0 0  Easily annoyed or irritable 0 0  Afraid - awful might happen 0 0  Total GAD 7 Score 0 0  Anxiety Difficulty Not difficult at all Not difficult at all        03/21/2024    9:03 AM  Fall Risk  Falls in the past year? 0  Was there an injury with Fall? 0  Fall Risk Category Calculator 0  Patient at Risk for Falls Due to No Fall Risks  Fall risk Follow up Falls evaluation completed    Functional Status Survey:      Past Medical History:  Past Medical History:  Diagnosis Date   Anxiety    Diverticulitis    IBS (irritable bowel syndrome)    Mitral valve disorder     Thalassanemia     Surgical History:  Past Surgical History:  Procedure Laterality Date   CESAREAN SECTION     x2   COLONOSCOPY     TONSILLECTOMY      Medications:  Current Outpatient Medications on File Prior to Visit  Medication Sig   Cholecalciferol 25 MCG (1000 UT) capsule Take 1,000 Units by mouth daily.   Multiple Vitamin (MULTIVITAMIN) capsule Take 1 capsule by mouth daily.   Omega 3 1000 MG CAPS Take 1 capsule by mouth daily.   omeprazole (PRILOSEC) 10 MG capsule Take 10 mg by mouth daily as needed.   Probiotic Product (PROBIOTIC PO) Take 1 tablet by mouth daily at 2 PM.   SOOLANTRA 1 % CREA Apply topically at bedtime.   No current facility-administered medications on file prior to visit.    Allergies:  Allergies  Allergen Reactions   Codeine     GI upset    Social History:  Social History   Socioeconomic History   Marital status: Married    Spouse name: Not  on file   Number of children: Not on file   Years of education: Not on file   Highest education level: Not on file  Occupational History   Not on file  Tobacco Use   Smoking status: Never   Smokeless tobacco: Never  Vaping Use   Vaping status: Never Used  Substance and Sexual Activity   Alcohol use: No   Drug use: Never   Sexual activity: Yes  Other Topics Concern   Not on file  Social History Narrative   Not on file   Social Drivers of Health   Financial Resource Strain: Low Risk  (03/21/2024)   Overall Financial Resource Strain (CARDIA)    Difficulty of Paying Living Expenses: Not hard at all  Food Insecurity: No Food Insecurity (03/21/2024)   Hunger Vital Sign    Worried About Running Out of Food in the Last Year: Never true    Ran Out of Food in the Last Year: Never true  Transportation Needs: No Transportation Needs (03/21/2024)   PRAPARE - Administrator, Civil Service (Medical): No    Lack of Transportation (Non-Medical): No  Physical Activity: Insufficiently Active  (03/21/2024)   Exercise Vital Sign    Days of Exercise per Week: 2 days    Minutes of Exercise per Session: 30 min  Stress: No Stress Concern Present (03/21/2024)   [employer] of Occupational Health - Occupational Stress Questionnaire    Feeling of Stress : Not at all  Social Connections: Moderately Integrated (03/21/2024)   Social Connection and Isolation Panel    Frequency of Communication with Friends and Family: More than three times a week    Frequency of Social Gatherings with Friends and Family: More than three times a week    Attends Religious Services: More than 4 times per year    Active Member of [employer] or Organizations: No    Attends Banker Meetings: Never    Marital Status: Married  Catering Manager Violence: Not At Risk (03/21/2024)   Humiliation, Afraid, Rape, and Kick questionnaire    Fear of Current or Ex-Partner: No    Emotionally Abused: No    Physically Abused: No    Sexually Abused: No   Social History   Tobacco Use  Smoking Status Never  Smokeless Tobacco Never   Social History   Substance and Sexual Activity  Alcohol Use No    Family History:  Family History  Problem Relation Age of Onset   Dementia Mother    Thalassemia Mother    Hypertension Father    Diabetes Father    Heart attack Father    Dementia Maternal Grandmother    Hypertension Paternal Grandmother    Diabetes Paternal Grandmother    Hypertension Paternal Grandfather    Breast cancer Neg Hx    Past medical history, surgical history, medications, allergies, family history and social history reviewed with patient today and changes made to appropriate areas of the chart.   ROS All other ROS negative except what is listed above and in the HPI.      Objective:    BP 119/78   Pulse 78   Temp 97.8 F (36.6 C) (Oral)   Ht 5' 3 (1.6 m)   Wt 139 lb (63 kg)   SpO2 97%   BMI 24.62 kg/m   Wt Readings from Last 3 Encounters:  09/23/24 139 lb (63 kg)  03/21/24 137 lb  9.6 oz (62.4 kg)  02/05/23 135 lb (61.2  kg)    Physical Exam Vitals and nursing note reviewed.  Constitutional:      General: She is awake. She is not in acute distress.    Appearance: She is well-developed and well-groomed. She is not ill-appearing or toxic-appearing.  HENT:     Head: Normocephalic and atraumatic.     Right Ear: Hearing, tympanic membrane, ear canal and external ear normal. No drainage.     Left Ear: Hearing, tympanic membrane, ear canal and external ear normal. No drainage.     Nose: Nose normal.     Right Sinus: No maxillary sinus tenderness or frontal sinus tenderness.     Left Sinus: No maxillary sinus tenderness or frontal sinus tenderness.     Mouth/Throat:     Mouth: Mucous membranes are moist.     Pharynx: Oropharynx is clear. Uvula midline. No pharyngeal swelling, oropharyngeal exudate or posterior oropharyngeal erythema.  Eyes:     General: Lids are normal.        Right eye: No discharge.        Left eye: No discharge.     Extraocular Movements: Extraocular movements intact.     Conjunctiva/sclera: Conjunctivae normal.     Pupils: Pupils are equal, round, and reactive to light.     Visual Fields: Right eye visual fields normal and left eye visual fields normal.  Neck:     Thyroid: No thyromegaly.     Vascular: No carotid bruit.     Trachea: Trachea normal.  Cardiovascular:     Rate and Rhythm: Normal rate and regular rhythm.     Heart sounds: Normal heart sounds. No murmur heard.    No gallop.  Pulmonary:     Effort: Pulmonary effort is normal. No accessory muscle usage or respiratory distress.     Breath sounds: Normal breath sounds.  Chest:     Comments: Deferred per patient request. Abdominal:     General: Bowel sounds are normal.     Palpations: Abdomen is soft. There is no hepatomegaly or splenomegaly.     Tenderness: There is no abdominal tenderness.  Musculoskeletal:        General: Normal range of motion.     Cervical back: Normal range  of motion and neck supple.     Right lower leg: No edema.     Left lower leg: No edema.  Lymphadenopathy:     Head:     Right side of head: No submental, submandibular, tonsillar, preauricular or posterior auricular adenopathy.     Left side of head: No submental, submandibular, tonsillar, preauricular or posterior auricular adenopathy.     Cervical: No cervical adenopathy.  Skin:    General: Skin is warm and dry.     Capillary Refill: Capillary refill takes less than 2 seconds.     Findings: No rash.  Neurological:     Mental Status: She is alert and oriented to person, place, and time.     Gait: Gait is intact.     Deep Tendon Reflexes: Reflexes are normal and symmetric.     Reflex Scores:      Brachioradialis reflexes are 2+ on the right side and 2+ on the left side.      Patellar reflexes are 2+ on the right side and 2+ on the left side. Psychiatric:        Attention and Perception: Attention normal.        Mood and Affect: Mood normal.  Speech: Speech normal.        Behavior: Behavior normal. Behavior is cooperative.        Thought Content: Thought content normal.        Judgment: Judgment normal.     Results for orders placed or performed in visit on 09/23/24  Hepatitis C antibody   Collection Time: 10/23/20 12:00 AM  Result Value Ref Range   Hepatitis C Ab Reactive       Assessment & Plan:   Problem List Items Addressed This Visit       Digestive   Irritable bowel syndrome with constipation   Chronic and ongoing.  Continue current OTC regimen.  Ensure plenty of water and fiber intake daily.  Goal is 3 to 4 BM weekly with no straining.  Educated her on this plan. Labs outpatient.      Relevant Orders   TSH     Other   Thalassemia - Primary   Chronic, stable.  Family history mom and brothers.  Recent labs H/H lower levels, but iron normal. Recheck CBC outpatient. Refer to hematology as needed.      Relevant Orders   CBC with Differential/Platelet    Generalized anxiety disorder   Chronic, stable with Paxil.  Has taken for years and tolerates well.  Continue this regimen.  Denies SI/HI. Will send refills.  Sent in #10 Xanax 0.5 MG to take only for fear of flying as she has upcoming travels to [place]  and is very nervous.  Educated her on this.      Relevant Medications   PARoxetine (PAXIL) 10 MG tablet   ALPRAZolam (XANAX) 0.5 MG tablet   Other Visit Diagnoses       Vitamin D deficiency       Check outpatient. History of low levels.   Relevant Orders   VITAMIN D 25 Hydroxy (Vit-D Deficiency, Fractures)     Flu vaccine need       Flu vaccine today, educated patient.   Relevant Orders   Flu vaccine trivalent PF, 6mos and older(Flulaval,Afluria,Fluarix,Fluzone) (Completed)     Encounter for screening for HIV       HIV screening today, educated patient on this.   Relevant Orders   HIV Antibody (routine testing w rflx)     Encounter for screening mammogram for malignant neoplasm of breast       Mammogram ordered and she is aware to schedule.   Relevant Orders   MM 3D SCREENING MAMMOGRAM BILATERAL BREAST     Encounter for lipid screening for cardiovascular disease       Lipid panel today.   Relevant Orders   Comprehensive metabolic panel with GFR   Lipid Panel w/o Chol/HDL Ratio     Encounter for annual physical exam       Annual physical today with labs and health maintenance reviewed, discussed with patient.        Follow up plan: Return in about 6 months (around 03/23/2025) for ANXIETY + needs outpatient lab only visit next week.   LABORATORY TESTING:  - Pap smear: up to date  IMMUNIZATIONS:   - Tdap: Tetanus vaccination status reviewed: last tetanus booster within 10 years. - Influenza: Administered today - Pneumovax: Not applicable - Prevnar: Refused - COVID: Up to date x 2 initial - HPV: Not applicable - Shingrix vaccine: Refused  SCREENING: -Mammogram: Ordered today  - Colonoscopy: Up to date  - Bone  Density: Not applicable  -Hearing Test: Not applicable  -Spirometry: Not applicable  PATIENT COUNSELING:   Advised to take 1 mg of folate supplement per day if capable of pregnancy.   Sexuality: Discussed sexually transmitted diseases, partner selection, use of condoms, avoidance of unintended pregnancy  and contraceptive alternatives.   Advised to avoid cigarette smoking.  I discussed with the patient that most people either abstain from alcohol or drink within safe limits (<=14/week and <=4 drinks/occasion for males, <=7/weeks and <= 3 drinks/occasion for females) and that the risk for alcohol disorders and other health effects rises proportionally with the number of drinks per week and how often a drinker exceeds daily limits.  Discussed cessation/primary prevention of drug use and availability of treatment for abuse.   Diet: Encouraged to adjust caloric intake to maintain  or achieve ideal body weight, to reduce intake of dietary saturated fat and total fat, to limit sodium intake by avoiding high sodium foods and not adding table salt, and to maintain adequate dietary potassium and calcium preferably from fresh fruits, vegetables, and low-fat dairy products.    Stressed the importance of regular exercise  Injury prevention: Discussed safety belts, safety helmets, smoke detector, smoking near bedding or upholstery.   Dental health: Discussed importance of regular tooth brushing, flossing, and dental visits.    NEXT PREVENTATIVE PHYSICAL DUE IN 1 YEAR. Return in about 6 months (around 03/23/2025) for ANXIETY + needs outpatient lab only visit next week.

## 2024-09-30 ENCOUNTER — Other Ambulatory Visit

## 2024-10-03 ENCOUNTER — Other Ambulatory Visit

## 2024-10-03 DIAGNOSIS — Z1231 Encounter for screening mammogram for malignant neoplasm of breast: Secondary | ICD-10-CM

## 2024-10-03 DIAGNOSIS — E559 Vitamin D deficiency, unspecified: Secondary | ICD-10-CM

## 2024-10-03 DIAGNOSIS — Z114 Encounter for screening for human immunodeficiency virus [HIV]: Secondary | ICD-10-CM

## 2024-10-03 DIAGNOSIS — D568 Other thalassemias: Secondary | ICD-10-CM

## 2024-10-03 DIAGNOSIS — K581 Irritable bowel syndrome with constipation: Secondary | ICD-10-CM

## 2024-10-03 DIAGNOSIS — Z13220 Encounter for screening for lipoid disorders: Secondary | ICD-10-CM

## 2024-10-04 ENCOUNTER — Ambulatory Visit: Payer: Self-pay | Admitting: Nurse Practitioner

## 2024-10-04 DIAGNOSIS — D649 Anemia, unspecified: Secondary | ICD-10-CM

## 2024-10-04 LAB — CBC WITH DIFFERENTIAL/PLATELET
Basophils Absolute: 0 x10E3/uL (ref 0.0–0.2)
Basos: 1 %
EOS (ABSOLUTE): 0.2 x10E3/uL (ref 0.0–0.4)
Eos: 4 %
Hematocrit: 36.4 % (ref 34.0–46.6)
Hemoglobin: 10.7 g/dL — ABNORMAL LOW (ref 11.1–15.9)
Immature Grans (Abs): 0 x10E3/uL (ref 0.0–0.1)
Immature Granulocytes: 0 %
Lymphocytes Absolute: 2.1 x10E3/uL (ref 0.7–3.1)
Lymphs: 35 %
MCH: 19.6 pg — ABNORMAL LOW (ref 26.6–33.0)
MCHC: 29.4 g/dL — ABNORMAL LOW (ref 31.5–35.7)
MCV: 67 fL — ABNORMAL LOW (ref 79–97)
Monocytes Absolute: 0.4 x10E3/uL (ref 0.1–0.9)
Monocytes: 6 %
Neutrophils Absolute: 3.3 x10E3/uL (ref 1.4–7.0)
Neutrophils: 54 %
Platelets: 346 x10E3/uL (ref 150–450)
RBC: 5.45 x10E6/uL — ABNORMAL HIGH (ref 3.77–5.28)
RDW: 15.5 % — ABNORMAL HIGH (ref 11.7–15.4)
WBC: 6 x10E3/uL (ref 3.4–10.8)

## 2024-10-04 LAB — COMPREHENSIVE METABOLIC PANEL WITH GFR
ALT: 21 IU/L (ref 0–32)
AST: 26 IU/L (ref 0–40)
Albumin: 4.1 g/dL (ref 3.9–4.9)
Alkaline Phosphatase: 80 IU/L (ref 49–135)
BUN/Creatinine Ratio: 15 (ref 12–28)
BUN: 11 mg/dL (ref 8–27)
Bilirubin Total: 0.6 mg/dL (ref 0.0–1.2)
CO2: 26 mmol/L (ref 20–29)
Calcium: 9.5 mg/dL (ref 8.7–10.3)
Chloride: 102 mmol/L (ref 96–106)
Creatinine, Ser: 0.72 mg/dL (ref 0.57–1.00)
Globulin, Total: 2 g/dL (ref 1.5–4.5)
Glucose: 88 mg/dL (ref 70–99)
Potassium: 4.1 mmol/L (ref 3.5–5.2)
Sodium: 142 mmol/L (ref 134–144)
Total Protein: 6.1 g/dL (ref 6.0–8.5)
eGFR: 95 mL/min/1.73 (ref >59)

## 2024-10-04 LAB — LIPID PANEL W/O CHOL/HDL RATIO
Cholesterol, Total: 188 mg/dL (ref 100–199)
HDL: 57 mg/dL (ref >39)
LDL Chol Calc (NIH): 112 mg/dL — ABNORMAL HIGH (ref 0–99)
Triglycerides: 106 mg/dL (ref 0–149)
VLDL Cholesterol Cal: 19 mg/dL (ref 5–40)

## 2024-10-04 LAB — TSH: TSH: 1.84 u[IU]/mL (ref 0.450–4.500)

## 2024-10-04 LAB — VITAMIN D 25 HYDROXY (VIT D DEFICIENCY, FRACTURES): Vit D, 25-Hydroxy: 36.5 ng/mL (ref 30.0–100.0)

## 2024-10-04 LAB — HIV ANTIBODY (ROUTINE TESTING W REFLEX): HIV Screen 4th Generation wRfx: NONREACTIVE

## 2024-10-04 NOTE — Progress Notes (Signed)
 Scheduled

## 2024-10-04 NOTE — Progress Notes (Signed)
 Contacted via MyChart -- lab only visit in 4 weeks please   Good morning Lisa Rowe, your labs have returned: - Lipid panel shows mild elevation in LDL, bad cholesterol. But at this time goal is focusing on healthy diet changes and regular exercise. No medication needed at this time. - CBC is showing a lower hemoglobin level.  I would like to recheck this outpatient in [redacted] weeks along with your iron and B12 level to see if supplements may be needed. Staff will call to schedule this lab only visit. - Remainder of labs look great, no medication changes needed. Any questions? Keep being amazing!!  Thank you for allowing me to participate in your care.  I appreciate you. Kindest regards, Harlo Jaso

## 2024-10-31 ENCOUNTER — Ambulatory Visit
Admission: RE | Admit: 2024-10-31 | Discharge: 2024-10-31 | Disposition: A | Discharge status: Home / Self Care | Source: Ambulatory Visit | Attending: Nurse Practitioner | Admitting: Nurse Practitioner

## 2024-10-31 DIAGNOSIS — Z1231 Encounter for screening mammogram for malignant neoplasm of breast: Secondary | ICD-10-CM | POA: Diagnosis present

## 2024-11-01 ENCOUNTER — Other Ambulatory Visit

## 2024-11-01 DIAGNOSIS — D649 Anemia, unspecified: Secondary | ICD-10-CM

## 2024-11-02 ENCOUNTER — Ambulatory Visit: Payer: Self-pay | Admitting: Nurse Practitioner

## 2024-11-02 LAB — CBC WITH DIFFERENTIAL/PLATELET
Basophils Absolute: 0 x10E3/uL (ref 0.0–0.2)
Basos: 1 %
EOS (ABSOLUTE): 0.4 x10E3/uL (ref 0.0–0.4)
Eos: 6 %
Hematocrit: 36.5 % (ref 34.0–46.6)
Hemoglobin: 11 g/dL — ABNORMAL LOW (ref 11.1–15.9)
Immature Grans (Abs): 0 x10E3/uL (ref 0.0–0.1)
Immature Granulocytes: 0 %
Lymphocytes Absolute: 2.5 x10E3/uL (ref 0.7–3.1)
Lymphs: 40 %
MCH: 20 pg — ABNORMAL LOW (ref 26.6–33.0)
MCHC: 30.1 g/dL — ABNORMAL LOW (ref 31.5–35.7)
MCV: 66 fL — ABNORMAL LOW (ref 79–97)
Monocytes Absolute: 0.4 x10E3/uL (ref 0.1–0.9)
Monocytes: 6 %
Neutrophils Absolute: 3 x10E3/uL (ref 1.4–7.0)
Neutrophils: 47 %
Platelets: 366 x10E3/uL (ref 150–450)
RBC: 5.51 x10E6/uL — ABNORMAL HIGH (ref 3.77–5.28)
RDW: 15.7 % — ABNORMAL HIGH (ref 11.7–15.4)
WBC: 6.3 x10E3/uL (ref 3.4–10.8)

## 2024-11-02 LAB — VITAMIN B12: Vitamin B-12: 581 pg/mL (ref 232–1245)

## 2024-11-02 LAB — FERRITIN: Ferritin: 65 ng/mL (ref 15–150)

## 2024-11-02 LAB — IRON: Iron: 95 ug/dL (ref 27–139)

## 2024-11-02 NOTE — Progress Notes (Signed)
 Contacted via MyChart  Good morning Elisse, your labs have returned and hemoglobin remains a little low. Iron and B12 levels are normal. Looking back this has been your baseline since 2018. We will continue to monitor these closely and if levels decline will get you into hematology. For now focus on an iron rich diet and a multivitamin daily may be beneficial. Any questions? Keep being amazing!!  Thank you for allowing me to participate in your care.  I appreciate you. Kindest regards, Krish Bailly

## 2024-11-07 NOTE — Progress Notes (Signed)
 Contacted via MyChart   Normal mammogram, may repeat in one year:)

## 2025-03-21 ENCOUNTER — Ambulatory Visit: Admitting: Nurse Practitioner

## 2025-03-28 ENCOUNTER — Ambulatory Visit: Admitting: Nurse Practitioner
